# Patient Record
Sex: Male | Born: 1995 | Race: White | Hispanic: No | Marital: Single | Smoking: Never smoker
Health system: Southern US, Community
[De-identification: ages and names within clinical notes are randomized; demographics above are authoritative.]

## PROBLEM LIST (undated history)

## (undated) DIAGNOSIS — E669 Obesity, unspecified: Secondary | ICD-10-CM

## (undated) DIAGNOSIS — J45909 Unspecified asthma, uncomplicated: Secondary | ICD-10-CM

## (undated) HISTORY — PX: UMBILICAL HERNIA REPAIR: SHX196

## (undated) HISTORY — PX: INGUINAL HERNIA REPAIR: SUR1180

## (undated) HISTORY — PX: HYDROCELE EXCISION / REPAIR: SUR1145

---
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
## 2015-02-07 ENCOUNTER — Emergency Department (HOSPITAL_COMMUNITY)
Admission: EM | Admit: 2015-02-07 | Discharge: 2015-02-08 | Disposition: A | Discharge status: Home / Self Care | Payer: Managed Care, Other (non HMO) | Attending: Emergency Medicine | Admitting: Emergency Medicine

## 2015-02-07 ENCOUNTER — Emergency Department (HOSPITAL_COMMUNITY): Payer: Managed Care, Other (non HMO)

## 2015-02-07 DIAGNOSIS — S0083XA Contusion of other part of head, initial encounter: Secondary | ICD-10-CM | POA: Insufficient documentation

## 2015-02-07 DIAGNOSIS — Y9289 Other specified places as the place of occurrence of the external cause: Secondary | ICD-10-CM | POA: Diagnosis not present

## 2015-02-07 DIAGNOSIS — S6991XA Unspecified injury of right wrist, hand and finger(s), initial encounter: Secondary | ICD-10-CM | POA: Diagnosis not present

## 2015-02-07 DIAGNOSIS — Y998 Other external cause status: Secondary | ICD-10-CM | POA: Insufficient documentation

## 2015-02-07 DIAGNOSIS — Y9389 Activity, other specified: Secondary | ICD-10-CM | POA: Diagnosis not present

## 2015-02-07 DIAGNOSIS — M25531 Pain in right wrist: Secondary | ICD-10-CM

## 2015-02-07 DIAGNOSIS — J45909 Unspecified asthma, uncomplicated: Secondary | ICD-10-CM | POA: Insufficient documentation

## 2015-02-07 DIAGNOSIS — Z79899 Other long term (current) drug therapy: Secondary | ICD-10-CM | POA: Insufficient documentation

## 2015-02-07 DIAGNOSIS — E669 Obesity, unspecified: Secondary | ICD-10-CM | POA: Diagnosis not present

## 2015-02-07 NOTE — ED Notes (Signed)
Denies LOC, Ambulatory.
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
## 2015-02-08 NOTE — Discharge Instructions (Signed)
Cryotherapy °Cryotherapy means treatment with cold. Ice or gel packs can be used to reduce both pain and swelling. Ice is the most helpful within the first 24 to 48 hours after an injury or flare-up from overusing a muscle or joint. Sprains, strains, spasms, burning pain, shooting pain, and aches can all be eased with ice. Ice can also be used when recovering from surgery. Ice is effective, has very few side effects, and is safe for most people to use. °PRECAUTIONS  °Ice is not a safe treatment option for people with: °· Raynaud phenomenon. This is a condition affecting small blood vessels in the extremities. Exposure to cold may cause your problems to return. °· Cold hypersensitivity. There are many forms of cold hypersensitivity, including: °· Cold urticaria. Red, itchy hives appear on the skin when the tissues begin to warm after being iced. °· Cold erythema. This is a red, itchy rash caused by exposure to cold. °· Cold hemoglobinuria. Red blood cells break down when the tissues begin to warm after being iced. The hemoglobin that carry oxygen are passed into the urine because they cannot combine with blood proteins fast enough. °· Numbness or altered sensitivity in the area being iced. °If you have any of the following conditions, do not use ice until you have discussed cryotherapy with your caregiver: °· Heart conditions, such as arrhythmia, angina, or chronic heart disease. °· High blood pressure. °· Healing wounds or open skin in the area being iced. °· Current infections. °· Rheumatoid arthritis. °· Poor circulation. °· Diabetes. °Ice slows the blood flow in the region it is applied. This is beneficial when trying to stop inflamed tissues from spreading irritating chemicals to surrounding tissues. However, if you expose your skin to cold temperatures for too long or without the proper protection, you can damage your skin or nerves. Watch for signs of skin damage due to cold. °HOME CARE INSTRUCTIONS °Follow  these tips to use ice and cold packs safely. °· Place a dry or damp towel between the ice and skin. A damp towel will cool the skin more quickly, so you may need to shorten the time that the ice is used. °· For a more rapid response, add gentle compression to the ice. °· Ice for no more than 10 to 20 minutes at a time. The bonier the area you are icing, the less time it will take to get the benefits of ice. °· Check your skin after 5 minutes to make sure there are no signs of a poor response to cold or skin damage. °· Rest 20 minutes or more between uses. °· Once your skin is numb, you can end your treatment. You can test numbness by very lightly touching your skin. The touch should be so light that you do not see the skin dimple from the pressure of your fingertip. When using ice, most people will feel these normal sensations in this order: cold, burning, aching, and numbness. °· Do not use ice on someone who cannot communicate their responses to pain, such as small children or people with dementia. °HOW TO MAKE AN ICE PACK °Ice packs are the most common way to use ice therapy. Other methods include ice massage, ice baths, and cryosprays. Muscle creams that cause a cold, tingly feeling do not offer the same benefits that ice offers and should not be used as a substitute unless recommended by your caregiver. °To make an ice pack, do one of the following: °· Place crushed ice or a   bag of frozen vegetables in a sealable plastic bag. Squeeze out the excess air. Place this bag inside another plastic bag. Slide the bag into a pillowcase or place a damp towel between your skin and the bag. °· Mix 3 parts water with 1 part rubbing alcohol. Freeze the mixture in a sealable plastic bag. When you remove the mixture from the freezer, it will be slushy. Squeeze out the excess air. Place this bag inside another plastic bag. Slide the bag into a pillowcase or place a damp towel between your skin and the bag. °SEEK MEDICAL CARE  IF: °· You develop white spots on your skin. This may give the skin a blotchy (mottled) appearance. °· Your skin turns blue or pale. °· Your skin becomes waxy or hard. °· Your swelling gets worse. °MAKE SURE YOU:  °· Understand these instructions. °· Will watch your condition. °· Will get help right away if you are not doing well or get worse. °  °This information is not intended to replace advice given to you by your health care provider. Make sure you discuss any questions you have with your health care provider. °  °Document Released: 10/16/2010 Document Revised: 03/12/2014 Document Reviewed: 10/16/2010 °Elsevier Interactive Patient Education ©2016 [employer] ° °Joint Pain °Joint pain, which is also called arthralgia, can be caused by many things. Joint pain often goes away when you follow your health care provider's instructions for relieving pain at home. However, joint pain can also be caused by conditions that require further treatment. Common causes of joint pain include: °· Bruising in the area of the joint. °· Overuse of the joint. °· Wear and tear on the joints that occur with aging (osteoarthritis). °· Various other forms of arthritis. °· A buildup of a crystal form of uric acid in the joint (gout). °· Infections of the joint (septic arthritis) or of the bone (osteomyelitis). °Your health care provider may recommend medicine to help with the pain. If your joint pain continues, additional tests may be needed to diagnose your condition. °HOME CARE INSTRUCTIONS °Watch your condition for any changes. Follow these instructions as directed to lessen the pain that you are feeling. °· Take medicines only as directed by your health care provider. °· Rest the affected area for as long as your health care provider says that you should. If directed to do so, raise the painful joint above the level of your heart while you are sitting or lying down. °· Do not do things that cause or worsen pain. °· If directed,  apply ice to the painful area: °¨ Put ice in a plastic bag. °¨ Place a towel between your skin and the bag. °¨ Leave the ice on for 20 minutes, 2-3 times per day. °· Wear an elastic bandage, splint, or sling as directed by your health care provider. Loosen the elastic bandage or splint if your fingers or toes become numb and tingle, or if they turn cold and blue. °· Begin exercising or stretching the affected area as directed by your health care provider. Ask your health care provider what types of exercise are safe for you. °· Keep all follow-up visits as directed by your health care provider. This is important. °SEEK MEDICAL CARE IF: °· Your pain increases, and medicine does not help. °· Your joint pain does not improve within 3 days. °· You have increased bruising or swelling. °· You have a fever. °· You lose 10 lb (4.5 kg) or more without trying. °SEEK IMMEDIATE   MEDICAL CARE IF: °· You are not able to move the joint. °· Your fingers or toes become numb or they turn cold and blue. °  °This information is not intended to replace advice given to you by your health care provider. Make sure you discuss any questions you have with your health care provider. °  °Document Released: 02/19/2005 Document Revised: 03/12/2014 Document Reviewed: 12/01/2013 °Elsevier Interactive Patient Education ©2016 [employer]vier Inc. ° °
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
## 2018-07-04 ENCOUNTER — Ambulatory Visit (HOSPITAL_COMMUNITY)
Admission: EM | Admit: 2018-07-04 | Discharge: 2018-07-04 | Disposition: A | Discharge status: Home / Self Care | Payer: Managed Care, Other (non HMO) | Attending: Family Medicine | Admitting: Family Medicine

## 2018-07-04 ENCOUNTER — Other Ambulatory Visit: Payer: Self-pay

## 2018-07-04 ENCOUNTER — Encounter (HOSPITAL_COMMUNITY): Payer: Self-pay | Admitting: Emergency Medicine

## 2018-07-04 DIAGNOSIS — R110 Nausea: Secondary | ICD-10-CM | POA: Diagnosis not present

## 2018-07-04 DIAGNOSIS — R630 Anorexia: Secondary | ICD-10-CM | POA: Diagnosis not present

## 2018-07-04 MED ORDER — ONDANSETRON 4 MG PO TBDP: 4.0000 mg | ORAL_TABLET | Freq: Three times a day (TID) | ORAL | 0 refills | Status: DC | PRN

## 2018-07-04 NOTE — Discharge Instructions (Signed)

## 2018-07-04 NOTE — ED Provider Notes (Signed)
[facility]    CSN: [id number] Arrival date & time: 07/04/18  1416     History   Chief Complaint Chief Complaint  Patient presents with   Nausea   Headache    HPI Jason Henson is a 23 y.o. male history of asthma, obesity presenting today for evaluation of nausea and lack of appetite.  Patient states that over the past 2 days he has noticed decreased appetite as well as nausea.  He denies any associated vomiting although frequently feels this is sensation as if he needs to.  Notes that he had diarrhea approximately 3 days ago, but this is not been persistent.  He denies any change in bowel movements.  Denies associated abdominal pain.  Denies any fevers, but notes that yesterday he felt warm.  He has not had congestion, sore throat.  Has had a very infrequent cough.  He has tried Gatorade, water as well as Napping.  Denies close contacts with similar symptoms.  Denies exposure to COVID-19.  He has had mild headache today.  Denies vision changes.  Denies weakness.  HPI  Past Medical History:  Diagnosis Date   Asthma    Obesity     There are no active problems to display for this patient.   Past Surgical History:  Procedure Laterality Date   HYDROCELE EXCISION / REPAIR     INGUINAL HERNIA REPAIR     UMBILICAL HERNIA REPAIR         Home Medications    Prior to Admission medications   Medication Sig Start Date End Date Taking? Authorizing Provider  mupirocin cream (BACTROBAN) 2 % Apply 1 application topically 2 (two) times daily. 10/21/14   Trixie Dredge, PA-C  ondansetron (ZOFRAN ODT) 4 MG disintegrating tablet Take 1 tablet (4 mg total) by mouth every 8 (eight) hours as needed for nausea or vomiting. 07/04/18   Isiac Breighner, Junius Creamer, PA-C    Family History No family history on file.  Social History Social History   Tobacco Use   Smoking status: Never Smoker  Substance Use Topics   Alcohol use: No   Drug use: No     Allergies   Patient has no  known allergies.   Review of Systems Review of Systems  Constitutional: Positive for appetite change. Negative for activity change, chills, fatigue and fever.  HENT: Negative for congestion, ear pain, rhinorrhea, sinus pressure, sore throat and trouble swallowing.   Eyes: Negative for discharge and redness.  Respiratory: Negative for cough, chest tightness and shortness of breath.   Cardiovascular: Negative for chest pain.  Gastrointestinal: Positive for nausea. Negative for abdominal pain, diarrhea and vomiting.  Musculoskeletal: Negative for myalgias.  Skin: Negative for rash.  Neurological: Negative for dizziness, light-headedness and headaches.     Physical Exam Triage Vital Signs ED Triage Vitals  Enc Vitals Group     BP 07/04/18 1440 (!) 137/92     Pulse Rate 07/04/18 1439 78     Resp 07/04/18 1439 18     Temp 07/04/18 1439 98.2 F (36.8 C)     Temp src --      SpO2 07/04/18 1439 97 %     Weight --      Height --      Head Circumference --      Peak Flow --      Pain Score 07/04/18 1439 4     Pain Loc --      Pain Edu? --  Excl. in GC? --    No data found.  Updated Vital Signs BP (!) 137/92    Pulse 78    Temp 98.2 F (36.8 C)    Resp 18    SpO2 97%   Visual Acuity Right Eye Distance:   Left Eye Distance:   Bilateral Distance:    Right Eye Near:   Left Eye Near:    Bilateral Near:     Physical Exam Vitals signs and nursing note reviewed.  Constitutional:      Appearance: He is well-developed.     Comments: Sitting comfortably on exam table, no acute distress  HENT:     Head: Normocephalic and atraumatic.     Ears:     Comments: Bilateral ears without tenderness to palpation of external auricle, tragus and mastoid, EAC's without erythema or swelling, TM's with good bony landmarks and cone of light. Non erythematous.    Mouth/Throat:     Comments: Oral mucosa pink and moist, no tonsillar enlargement or exudate. Posterior pharynx patent and  nonerythematous, no uvula deviation or swelling. Normal phonation. Eyes:     Conjunctiva/sclera: Conjunctivae normal.  Neck:     Musculoskeletal: Neck supple.  Cardiovascular:     Rate and Rhythm: Normal rate and regular rhythm.     Heart sounds: No murmur.  Pulmonary:     Effort: Pulmonary effort is normal. No respiratory distress.     Breath sounds: Normal breath sounds.  Abdominal:     Palpations: Abdomen is soft.     Tenderness: There is abdominal tenderness.     Comments: Abdomen soft, nondistended, mild tenderness to palpation in right upper and lower quadrants, no focal tenderness, negative rebound, negative McBurney's, negative Murphy's, negative Rovsing.  Skin:    General: Skin is warm and dry.  Neurological:     Mental Status: He is alert.      UC Treatments / Results  Labs (all labs ordered are listed, but only abnormal results are displayed) Labs Reviewed - No data to display  EKG None  Radiology No results found.  Procedures Procedures (including critical care time)  Medications Ordered in UC Medications - No data to display  Initial Impression / Assessment and Plan / UC Course  I have reviewed the triage vital signs and the nursing notes.  Pertinent labs & imaging results that were available during my care of the patient were reviewed by me and considered in my medical decision making (see chart for details).     Patient with 3 days of nausea and poor appetite.  Abdominal exam negative for peritoneal signs/abdominal emergency.  Vital signs stable in clinic without tachycardia or fever.  Minimal URI symptoms.  Viral gastroenteritis most likely, will treat symptomatically and supportively with Zofran, pushing fluids.  Slowly transition diet.  Continue to monitor for development of URI symptoms or worsening abdominal pain.Discussed strict return precautions. Patient verbalized understanding and is agreeable with plan.  Final Clinical Impressions(s) / UC  Diagnoses   Final diagnoses:  Nausea  Poor appetite     Discharge Instructions     Your nausea, vomiting, and diarrhea appear to have a viral cause. Your symptoms should improve over the next week as your body continues to rid the infectious cause.  For nausea: Zofran prescribed. Begin with every 6 hours, than as you are able to hold food down, take it as needed. Start with clear liquids, then move to plain foods like bananas, rice, applesauce, toast, broth, grits, oatmeal. As  those food settle okay you may transition to your normal foods. Avoid spicy and greasy foods as much as possible.  For Diarrhea: This is your body's natural way of getting rid of a virus. You may try taking 1 imodium to decrease amount of stools a day, but we do not want you to stop your diarrhea.   Preventing dehydration is key! You need to replace the fluid your body is expelling. Drink plenty of fluids, may use Pedialyte or sports drinks.   Please return if you are experiencing blood in your vomit or stool or experiencing dizziness, lightheadedness, extreme fatigue, increased abdominal pain.    ED Prescriptions    Medication Sig Dispense Auth. Provider   ondansetron (ZOFRAN ODT) 4 MG disintegrating tablet Take 1 tablet (4 mg total) by mouth every 8 (eight) hours as needed for nausea or vomiting. 20 tablet Jerrol Helmers, HendricksHallie C, PA-C     Controlled Substance Prescriptions [place] Controlled Substance Registry consulted? Not Applicable   Lew DawesWieters, Yoltzin Barg C, [place]PA-C 07/04/18 1601

## 2018-07-04 NOTE — ED Triage Notes (Signed)
Pt c/o nausea for the last few days, poor appetite, states headache today.

## 2018-10-20 ENCOUNTER — Other Ambulatory Visit: Payer: Self-pay

## 2018-10-20 DIAGNOSIS — Z20822 Contact with and (suspected) exposure to covid-19: Secondary | ICD-10-CM

## 2018-10-21 LAB — NOVEL CORONAVIRUS, NAA: SARS-CoV-2, NAA: NOT DETECTED

## 2018-10-21 LAB — SPECIMEN STATUS REPORT

## 2020-10-01 ENCOUNTER — Ambulatory Visit
Admission: EM | Admit: 2020-10-01 | Discharge: 2020-10-01 | Disposition: A | Discharge status: Home / Self Care | Payer: BLUE CROSS/BLUE SHIELD | Attending: Family Medicine | Admitting: Family Medicine

## 2020-10-01 ENCOUNTER — Encounter: Payer: Self-pay | Admitting: Emergency Medicine

## 2020-10-01 ENCOUNTER — Other Ambulatory Visit: Payer: Self-pay

## 2020-10-01 DIAGNOSIS — K529 Noninfective gastroenteritis and colitis, unspecified: Secondary | ICD-10-CM | POA: Diagnosis not present

## 2020-10-01 LAB — POCT FASTING CBG KUC MANUAL ENTRY: POCT Glucose (KUC): 95 mg/dL (ref 70–99)

## 2020-10-01 MED ORDER — ONDANSETRON 4 MG PO TBDP: 4.0000 mg | ORAL_TABLET | Freq: Three times a day (TID) | ORAL | 0 refills | Status: DC | PRN

## 2020-10-01 MED ORDER — FAMOTIDINE 20 MG PO TABS: 20.0000 mg | ORAL_TABLET | Freq: Two times a day (BID) | ORAL | 0 refills | Status: AC

## 2020-10-01 NOTE — ED Triage Notes (Signed)
Patient c/o nausea x 2 days, no vomiting, diarrhea.  Patient is very tired and fatigued.  Patient took an at home COVID test this morning and it was negative.  Patient is vaccinated for COVID.

## 2020-10-01 NOTE — ED Provider Notes (Signed)
[facility]    CSN: [id number] Arrival date & time: 10/01/20  1350      History   Chief Complaint Chief Complaint  Patient presents with   Nausea    HPI Jason Henson is a 25 y.o. male.   HPI Patient in today with 2 days of nausea, watery bowel movements and abdominal upset and cramping.  Patient has not actually vomited however has felt fatigued.  He is taken 2 home COVID test which both were negative.  Denies any known sick contacts.  Initially thought he may have eaten something to upset his stomach however cannot recall specifically any intake of any specific foods that may have caused this current problems.  He is afebrile.  Denies any overt abdominal pain and denies any upper respiratory symptoms.  Past Medical History:  Diagnosis Date   Asthma    Obesity     There are no problems to display for this patient.   Past Surgical History:  Procedure Laterality Date   HYDROCELE EXCISION / REPAIR     INGUINAL HERNIA REPAIR     UMBILICAL HERNIA REPAIR         Home Medications    Prior to Admission medications   Medication Sig Start Date End Date Taking? Authorizing Provider  mupirocin cream (BACTROBAN) 2 % Apply 1 application topically 2 (two) times daily. 10/21/14   Trixie Dredge, PA-C  ondansetron (ZOFRAN ODT) 4 MG disintegrating tablet Take 1 tablet (4 mg total) by mouth every 8 (eight) hours as needed for nausea or vomiting. 07/04/18   Wieters, Junius Creamer, PA-C    Family History No family history on file.  Social History Social History   Tobacco Use   Smoking status: Never  Substance Use Topics   Alcohol use: No   Drug use: No     Allergies   Patient has no known allergies.   Review of Systems Review of Systems Pertinent negatives listed in HPI   Physical Exam Triage Vital Signs ED Triage Vitals  Enc Vitals Group     BP 10/01/20 1534 115/69     Pulse Rate 10/01/20 1534 71     Resp --      Temp 10/01/20 1534 97.9 F (36.6 C)      Temp Source 10/01/20 1534 Oral     SpO2 10/01/20 1534 97 %     Weight 10/01/20 1537 (!) 350 lb (158.8 kg)     Height 10/01/20 1537 6' (1.829 m)     Head Circumference --      Peak Flow --      Pain Score 10/01/20 1537 0     Pain Loc --      Pain Edu? --      Excl. in GC? --    No data found.  Updated Vital Signs BP 115/69 (BP Location: Left Arm)   Pulse 71   Temp 97.9 F (36.6 C) (Oral)   Ht 6' (1.829 m)   Wt (!) 350 lb (158.8 kg)   SpO2 97%   BMI 47.47 kg/m   Visual Acuity Right Eye Distance:   Left Eye Distance:   Bilateral Distance:    Right Eye Near:   Left Eye Near:    Bilateral Near:     Physical Exam General appearance: Alert, obese well nourished, cooperative  Head: Normocephalic, without obvious abnormality, atraumatic Respiratory: Respirations even and unlabored, normal respiratory rate Heart: Rate and rhythm normal. No gallop or murmurs noted on exam  Abdomen: BS +, no distention, no rebound tenderness, or no mass Extremities: No gross deformities Skin: Skin color, texture, turgor normal. No rashes seen  Psych: Appropriate mood and affect. Neurologic: GCS 15, normal coordination, normal gait UC Treatments / Results  Labs (all labs ordered are listed, but only abnormal results are displayed) Labs Reviewed  POCT FASTING CBG [facility] MANUAL ENTRY    EKG   Radiology No results found.  Procedures Procedures (including critical care time)  Medications Ordered in UC Medications - No data to display  Initial Impression / Assessment and Plan / UC Course  I have reviewed the triage vital signs and the nursing notes.  Pertinent labs & imaging results that were available during my care of the patient were reviewed by me and considered in my medical decision making (see chart for details).    Gastroenteritis, mild symptoms.  Likely viral in etiology.  Patient vital signs are stable.  Provided a work note to remain off work for the next 2 days.  Glucose is  normal.  Patient is able to eat and drink and has only had 2 loose stools today.  Symptomatic management warranted only.  ER precautions given if symptoms worsen or do not improve Final Clinical Impressions(s) / UC Diagnoses   Final diagnoses:  Gastroenteritis   Discharge Instructions   None    ED Prescriptions     Medication Sig Dispense Auth. Provider   ondansetron (ZOFRAN ODT) 4 MG disintegrating tablet Take 1 tablet (4 mg total) by mouth every 8 (eight) hours as needed for nausea or vomiting. 20 tablet Bing Neighbors, FNP   famotidine (PEPCID) 20 MG tablet Take 1 tablet (20 mg total) by mouth 2 (two) times daily for 5 days. 10 tablet Bing Neighbors, FNP      PDMP not reviewed this encounter.   Bing Neighbors, FNP 10/02/20 [phone]

## 2023-07-08 ENCOUNTER — Other Ambulatory Visit: Payer: Self-pay

## 2023-07-08 ENCOUNTER — Emergency Department (HOSPITAL_COMMUNITY)

## 2023-07-08 ENCOUNTER — Emergency Department (HOSPITAL_COMMUNITY)
Admission: EM | Admit: 2023-07-08 | Discharge: 2023-07-08 | Disposition: A | Discharge status: Home / Self Care | Attending: Emergency Medicine | Admitting: Emergency Medicine

## 2023-07-08 DIAGNOSIS — J45909 Unspecified asthma, uncomplicated: Secondary | ICD-10-CM | POA: Insufficient documentation

## 2023-07-08 DIAGNOSIS — R0789 Other chest pain: Secondary | ICD-10-CM | POA: Diagnosis present

## 2023-07-08 LAB — BASIC METABOLIC PANEL WITH GFR
Anion gap: 7 (ref 5–15)
BUN: 18 mg/dL (ref 6–20)
CO2: 27 mmol/L (ref 22–32)
Calcium: 9 mg/dL (ref 8.9–10.3)
Chloride: 101 mmol/L (ref 98–111)
Creatinine, Ser: 0.75 mg/dL (ref 0.61–1.24)
GFR, Estimated: >60 mL/min (ref >60)
Glucose, Bld: 103 mg/dL — ABNORMAL HIGH (ref 70–99)
Potassium: 4.1 mmol/L (ref 3.5–5.1)
Sodium: 135 mmol/L (ref 135–145)

## 2023-07-08 LAB — CBC
HCT: 44.9 % (ref 39.0–52.0)
Hemoglobin: 14.4 g/dL (ref 13.0–17.0)
MCH: 26.9 pg (ref 26.0–34.0)
MCHC: 32.1 g/dL (ref 30.0–36.0)
MCV: 83.8 fL (ref 80.0–100.0)
Platelets: 392 10*3/uL (ref 150–400)
RBC: 5.36 MIL/uL (ref 4.22–5.81)
RDW: 13.4 % (ref 11.5–15.5)
WBC: 8.2 10*3/uL (ref 4.0–10.5)
nRBC: 0 % (ref 0.0–0.2)

## 2023-07-08 LAB — TROPONIN I (HIGH SENSITIVITY)
Troponin I (High Sensitivity): 3 ng/L (ref <18)
Troponin I (High Sensitivity): 3 ng/L (ref <18)

## 2023-07-08 NOTE — Discharge Instructions (Signed)
 It was a pleasure taking care of you today. Today we evaluated you for acute life threatening causes of chest pain. Your EKG was completed today and showed no evidence of acute heart attack. Your chest xray showed no evidence of acute cardiopulmonary process, and your troponin blood tests came back at normal levels. Please follow-up with your primary care provider for further diagnosis and treatment. Return to the ED if your symptoms return or worsen.

## 2023-07-08 NOTE — ED Triage Notes (Signed)
 Pt arrived reporting chest pain, left side and shob that started this morning. Denies any other symptoms. No recent illness, Reports pain is not there at this time. States this morning did radiate down left arm

## 2023-07-08 NOTE — ED Provider Notes (Signed)
 [place] [facility] Provider Note   CSN: [id number] Arrival date & time: 07/08/23  4098     History  Chief Complaint  Patient presents with   Chest Pain   Shortness of Breath    Jason Henson is a 28 y.o. male who presents to the ED today with a chief complaint of chest pain radiating down his L arm and associated shortness of breath. Patient states that his wife was driving him to work when he suddenly started experiencing chest pain. Patient denies active chest pain at time of exam. Past medical history significant for GERD, pre-diabetes, and asthma. Patient denies having any asthma flares recently, stating he has not used his inhaler in quite some time. Denies recent illness or known sick contacts, denies abdominal pain, nausea, vomiting, changes in stool or urine, musculoskeletal weakness, syncope, or confusion. Denies known cardiac history but states that heart disease does run in his family, with his grandmother having a heart attack at the age of 59.    Chest Pain Associated symptoms: shortness of breath   Associated symptoms: no abdominal pain, no cough, no dizziness, no fever, no nausea, no vomiting and no weakness   Shortness of Breath Associated symptoms: chest pain   Associated symptoms: no abdominal pain, no cough, no fever, no vomiting and no wheezing        Home Medications Prior to Admission medications   Medication Sig Start Date End Date Taking? Authorizing Provider  famotidine  (PEPCID ) 20 MG tablet Take 1 tablet (20 mg total) by mouth 2 (two) times daily for 5 days. 10/01/20 10/06/20  Buena Carmine, NP  mupirocin  cream (BACTROBAN ) 2 % Apply 1 application topically 2 (two) times daily. 10/21/14   Clance Crigler, PA-C  ondansetron  (ZOFRAN  ODT) 4 MG disintegrating tablet Take 1 tablet (4 mg total) by mouth every 8 (eight) hours as needed for nausea or vomiting. 10/01/20   Buena Carmine, NP      Allergies    Patient has no  known allergies.    Review of Systems   Review of Systems  Constitutional:  Negative for appetite change, chills and fever.  Respiratory:  Positive for chest tightness and shortness of breath. Negative for cough, wheezing and stridor.   Cardiovascular:  Positive for chest pain. Negative for leg swelling.  Gastrointestinal:  Negative for abdominal distention, abdominal pain, blood in stool, diarrhea, nausea and vomiting.  Skin:  Negative for wound.  Neurological:  Negative for dizziness and weakness.    Physical Exam Updated Vital Signs BP (!) 144/68   Pulse (!) 55   Temp 97.7 F (36.5 C) (Oral)   Resp 20   SpO2 100%  Physical Exam Vitals reviewed.  Constitutional:      General: He is not in acute distress.    Appearance: He is well-developed. He is obese. He is not ill-appearing, toxic-appearing or diaphoretic.  HENT:     Head: Normocephalic and atraumatic.  Cardiovascular:     Rate and Rhythm: Normal rate and regular rhythm.     Heart sounds: Normal heart sounds. No murmur heard. Pulmonary:     Effort: Pulmonary effort is normal. No tachypnea, accessory muscle usage or respiratory distress.     Breath sounds: No stridor.  Chest:     Chest wall: Tenderness (Left sided chest tenderness with palpation) present.  Abdominal:     Palpations: Abdomen is soft.     Tenderness: There is no abdominal tenderness. There is  no guarding or rebound.  Musculoskeletal:        General: Normal range of motion.  Skin:    General: Skin is warm and dry.     Capillary Refill: Capillary refill takes less than 2 seconds.  Neurological:     General: No focal deficit present.     Mental Status: He is alert and oriented to person, place, and time.     ED Results / Procedures / Treatments   Labs (all labs ordered are listed, but only abnormal results are displayed) Labs Reviewed  BASIC METABOLIC PANEL WITH GFR - Abnormal; Notable for the following components:      Result Value   Glucose,  Bld 103 (*)    All other components within normal limits  CBC  TROPONIN I (HIGH SENSITIVITY)  TROPONIN I (HIGH SENSITIVITY)    EKG EKG Interpretation Date/Time:  Monday Jul 08 2023 09:31:26 EDT Ventricular Rate:  71 PR Interval:  156 QRS Duration:  101 QT Interval:  381 QTC Calculation: 414 R Axis:   69  Text Interpretation: Sinus rhythm Low voltage, precordial leads No acute changes No significant change since last tracing Confirmed by Deatra Face [phone]) on 07/08/2023 9:47:03 AM  Radiology DG Chest 2 View Result Date: 07/08/2023 CLINICAL DATA:  Chest pain EXAM: CHEST - 2 VIEW COMPARISON:  None Available. FINDINGS: The heart size and mediastinal contours are within normal limits. Both lungs are clear. The visualized skeletal structures are unremarkable. IMPRESSION: No active cardiopulmonary disease. Electronically Signed   By: Reagan Camera M.D.   On: 07/08/2023 10:33    Procedures Procedures    Medications Ordered in ED Medications - No data to display  ED Course/ Medical Decision Making/ A&P Clinical Course as of 07/08/23 1241  Mon Jul 08, 2023  1230 Troponin I (High Sensitivity) [CH]    Clinical Course User Index [CH] Hill Mackie, Ruta Cousins, [place]    Patient presents to the ED for concern of chest pain radiating down left arm, and shortness of breath this involves an extensive number of treatment options, and is a complaint that carries with it a high risk of complications and morbidity.  The differential diagnosis includes: Acute coronary syndrome, pericarditis, aortic dissection, pulmonary embolism, tension pneumothorax, pneumonia, and esophageal rupture.    Co morbidities that complicate the patient evaluation  none   Lab Tests:  I Ordered, and personally interpreted labs.  The pertinent results include:  CBC unremarkable, BMP remarkable only for glucose of 103, troponin 3,3   Imaging Studies ordered:  I ordered imaging studies including CXR I independently  visualized and interpreted imaging which showed no acute cardiopulmonary disease I agree with the radiologist interpretation   Cardiac Monitoring:  The patient was maintained on a cardiac monitor.  I personally viewed and interpreted the cardiac monitored which showed an underlying rhythm of: Sinus   Medicines ordered and prescription drug management:  I have reviewed the patients home medicines and have made adjustments as needed   Test Considered:  D-dimer/CTA: declined due to patient having no calf pain, swelling, or redness. No known risk factors for DVT, no history of coagulation disorder. No report of hemoptysis, shortness of breath not present since being in the ED   Problem List / ED Course:  Chest pain, shortness of breath   Reevaluation:  After the interventions noted above, I reevaluated the patient and found that they have :improved   Social Determinants of Health:  none   Dispostion:  After consideration of  the diagnostic results and the patients response to treatment, I feel that the patent would benefit from discharge and follow-up with PCP for further diagnosis and management. All testing for acute causes of chest pain today negative.   Click here for ABCD2, HEART and other calculatorsREFRESH Note before signing :1}          HEART Score: 2                    Medical Decision Making Amount and/or Complexity of Data Reviewed Labs: ordered. Radiology: ordered.          Final Clinical Impression(s) / ED Diagnoses Final diagnoses:  None    Rx / DC Orders ED Discharge Orders     None         Fonda Hymen, PA-C 07/08/23 1302    Deatra Face, MD 07/09/23 [phone]

## 2024-02-07 ENCOUNTER — Telehealth: Admitting: Family Medicine

## 2024-02-07 DIAGNOSIS — B9789 Other viral agents as the cause of diseases classified elsewhere: Secondary | ICD-10-CM

## 2024-02-07 MED ORDER — PROMETHAZINE-DM 6.25-15 MG/5ML PO SYRP: 5.0000 mL | ORAL_SOLUTION | Freq: Four times a day (QID) | ORAL | 0 refills | Status: AC | PRN

## 2024-02-07 MED ORDER — PREDNISONE 20 MG PO TABS: 20.0000 mg | ORAL_TABLET | Freq: Two times a day (BID) | ORAL | 0 refills | Status: AC

## 2024-02-07 NOTE — Progress Notes (Signed)
 We are sorry that you are not feeling well.  Here is how we plan to help!  Based on what you have shared with me it looks like you have sinusitis.  Sinusitis is inflammation and infection in the sinus cavities of the head.  Based on your presentation I believe you most likely have Acute Viral Sinusitis.This is an infection most likely caused by a virus. There is not specific treatment for viral sinusitis other than to help you with the symptoms until the infection runs its course.  You may use an oral decongestant such as Mucinex D or if you have glaucoma or high blood pressure use plain Mucinex. Saline nasal spray help and can safely be used as often as needed for congestion, Some authorities believe that zinc sprays or the use of Echinacea may shorten the course of your symptoms.  Sinus infections are not as easily transmitted as other respiratory infection, however we still recommend that you avoid close contact with loved ones, especially the very young and elderly.  Remember to wash your hands thoroughly throughout the day as this is the number one way to prevent the spread of infection!  Home Care: Only take medications as instructed by your medical team. Do not take these medications with alcohol. A steam or ultrasonic humidifier can help congestion.  You can place a towel over your head and breathe in the steam from hot water coming from a faucet. Avoid close contacts especially the very young and the elderly. Cover your mouth when you cough or sneeze. Always remember to wash your hands.  Get Help Right Away If: You develop worsening fever or sinus pain. You develop a severe head ache or visual changes. Your symptoms persist after you have completed your treatment plan.  Make sure you Understand these instructions. Will watch your condition. Will get help right away if you are not doing well or get worse.  Your e-visit answers were reviewed by a board certified advanced clinical  practitioner to complete your personal care plan.  Depending on the condition, your plan could have included both over the counter or prescription medications.  If there is a problem please reply  once you have received a response from your provider.  Your safety is important to us .  If you have drug allergies check your prescription carefully.    You can use MyChart to ask questions about today's visit, request a non-urgent call back, or ask for a work or school excuse for 24 hours related to this e-Visit. If it has been greater than 24 hours you will need to follow up with your provider, or enter a new e-Visit to address those concerns.  You will get an e-mail in the next two days asking about your experience.  I hope that your e-visit has been valuable and will speed your recovery. Thank you for using e-visits.  I have spent 5 minutes in review of e-visit questionnaire, review and updating patient chart, medical decision making and response to patient.   Raejean Swinford, FNP

## 2024-02-22 ENCOUNTER — Telehealth: Admitting: Physician Assistant

## 2024-02-22 DIAGNOSIS — R112 Nausea with vomiting, unspecified: Secondary | ICD-10-CM | POA: Diagnosis not present

## 2024-02-22 MED ORDER — ONDANSETRON 4 MG PO TBDP: 4.0000 mg | ORAL_TABLET | Freq: Three times a day (TID) | ORAL | 0 refills | Status: AC | PRN

## 2024-02-22 NOTE — Progress Notes (Signed)
 We are sorry that you are not feeling well. Here is how we plan to help!  Based on what you have shared with me it looks like you have a Virus that is irritating your GI tract.  Vomiting is the forceful emptying of a portion of the stomach's content through the mouth.  Although nausea and vomiting can make you feel miserable, it's important to remember that these are not diseases, but rather symptoms of an underlying illness.  When we treat short term symptoms, we always caution that any symptoms that persist should be fully evaluated in a medical office.  I have prescribed a medication that will help alleviate your symptoms and allow you to stay hydrated:  Zofran  4 mg 1 tablet every 8 hours as needed for nausea and vomiting  HOME CARE: Drink clear liquids.  This is very important! Dehydration (the lack of fluid) can lead to a serious complication.  Start off with 1 tablespoon every 5 minutes for 8 hours. You may begin eating bland foods after 8 hours without vomiting.  Start with saltine crackers, white bread, rice, mashed potatoes, applesauce. After 48 hours on a bland diet, you may resume a normal diet. Try to go to sleep.  Sleep often empties the stomach and relieves the need to vomit.  GET HELP RIGHT AWAY IF:  Your symptoms do not improve or worsen within 2 days after treatment. You have a fever for over 3 days. You cannot keep down fluids after trying the medication.  MAKE SURE YOU:  Understand these instructions. Will watch your condition. Will get help right away if you are not doing well or get worse.   Thank you for choosing an e-visit. Your e-visit answers were reviewed by a board certified advanced clinical practitioner to complete your personal care plan. Depending upon the condition, your plan could have included both over the counter or prescription medications. Please review your pharmacy choice. Be sure that the pharmacy you have chosen is open so that you can pick up  your prescription now.  If there is a problem you may message your provider in MyChart to have the prescription routed to another pharmacy. Your safety is important to us . If you have drug allergies check your prescription carefully.  For the next 24 hours, you can use MyChart to ask questions about today's visit, request a non-urgent call back, or ask for a work or school excuse from your e-visit provider. You will get an e-mail in the next two days asking about your experience. I hope that your e-visit has been valuable and will speed your recovery.  I have spent 5 minutes in review of e-visit questionnaire, review and updating patient chart, medical decision making and response to patient.   Teena Shuck, PA-C
